# Patient Record
Sex: Female | Born: 1975 | Smoking: Never smoker
Health system: Southern US, Community
[De-identification: ages and names within clinical notes are randomized; demographics above are authoritative.]

## PROBLEM LIST (undated history)

## (undated) DIAGNOSIS — E119 Type 2 diabetes mellitus without complications: Secondary | ICD-10-CM

## (undated) DIAGNOSIS — E669 Obesity, unspecified: Secondary | ICD-10-CM

## (undated) DIAGNOSIS — Z9289 Personal history of other medical treatment: Secondary | ICD-10-CM

## (undated) DIAGNOSIS — E039 Hypothyroidism, unspecified: Secondary | ICD-10-CM

## (undated) HISTORY — DX: Type 2 diabetes mellitus without complications: E11.9

## (undated) HISTORY — DX: Hypothyroidism, unspecified: E03.9

## (undated) HISTORY — PX: NO PAST SURGERIES: SHX2092

## (undated) HISTORY — DX: Personal history of other medical treatment: Z92.89

## (undated) HISTORY — DX: Obesity, unspecified: E66.9

---

## 2004-04-22 ENCOUNTER — Inpatient Hospital Stay: Payer: Self-pay

## 2014-01-04 LAB — HM PAP SMEAR

## 2014-01-11 LAB — HM MAMMOGRAPHY

## 2017-03-24 ENCOUNTER — Ambulatory Visit (INDEPENDENT_AMBULATORY_CARE_PROVIDER_SITE_OTHER): Payer: BLUE CROSS/BLUE SHIELD | Admitting: Maternal Newborn

## 2017-03-24 ENCOUNTER — Encounter: Payer: Self-pay | Admitting: Maternal Newborn

## 2017-03-24 VITALS — BP 110/70 | HR 84 | Ht 61.0 in | Wt 235.0 lb

## 2017-03-24 DIAGNOSIS — N393 Stress incontinence (female) (male): Secondary | ICD-10-CM | POA: Diagnosis not present

## 2017-03-24 DIAGNOSIS — Z1239 Encounter for other screening for malignant neoplasm of breast: Secondary | ICD-10-CM

## 2017-03-24 DIAGNOSIS — Z8742 Personal history of other diseases of the female genital tract: Secondary | ICD-10-CM | POA: Insufficient documentation

## 2017-03-24 DIAGNOSIS — Z87898 Personal history of other specified conditions: Secondary | ICD-10-CM | POA: Diagnosis not present

## 2017-03-24 DIAGNOSIS — Z1231 Encounter for screening mammogram for malignant neoplasm of breast: Secondary | ICD-10-CM

## 2017-03-24 DIAGNOSIS — Z01419 Encounter for gynecological examination (general) (routine) without abnormal findings: Secondary | ICD-10-CM

## 2017-03-24 NOTE — Patient Instructions (Signed)
Kegel Exercises Kegel exercises help strengthen the muscles that support the rectum, vagina, small intestine, bladder, and uterus. Doing Kegel exercises can help:  Improve bladder and bowel control.  Improve sexual response.  Reduce problems and discomfort during pregnancy.  Kegel exercises involve squeezing your pelvic floor muscles, which are the same muscles you squeeze when you try to stop the flow of urine. The exercises can be done while sitting, standing, or lying down, but it is best to vary your position. Phase 1 exercises 1. Squeeze your pelvic floor muscles tight. You should feel a tight lift in your rectal area. If you are a female, you should also feel a tightness in your vaginal area. Keep your stomach, buttocks, and legs relaxed. 2. Hold the muscles tight for up to 10 seconds. 3. Relax your muscles. Repeat this exercise 50 times a day or as many times as told by your health care provider. Continue to do this exercise for at least 4-6 weeks or for as long as told by your health care provider. This information is not intended to replace advice given to you by your health care provider. Make sure you discuss any questions you have with your health care provider. Document Released: 03/08/2012 Document Revised: 11/15/2015 Document Reviewed: 02/09/2015 Elsevier Interactive Patient Education  2018 [employer] Ejercicios de Engineer, siteKegel (Kegel Exercises) Los ejercicios de Kegel ayudan a fortalecer los msculos que sostienen el recto, la vagina, el intestino delgado, la vejiga y [place] tero. Los ejercicios de Kegel pueden ayudar a lo siguiente:  Mejorar el control de la vejiga y de los intestinos.  Mejorar la respuesta sexual.  Reducir los problemas o las molestias durante el New Siteembarazo. Los ejercicios de Kegel implican apretar los msculos del suelo plvico, que son los mismos msculos que comprime cuando trata de TEFL teacherdetener el flujo de la [place] Pro[place] Groundorina. Los ejercicios se pueden Ecologistrealizar mientras est  sentado, parado o [place]ostado, pero lo mejor es variar la posicin. EJERCICIOS DE KEGEL 1. Apriete bien los msculos del suelo plvico. Debera sentir una elevacin firme en el rea del recto. Si es [place], tambin debera sentir una compresin en el rea de la vagina. Mantenga el [place], las nalgas y las piernas [address]relajadas. 2. Mantenga los msculos apretados durante 10segundos como mximo. 3. Relaje los msculos. Repita este ejercicio 50veces al da o como se lo haya indicado el mdico. Contine haciendo este ejercicio durante al menos 4 a 6semanas y Therapist, sportsdurante el tiempo que le haya indicado el mdico. Esta informacin no tiene Theme park managercomo fin reemplazar el consejo del mdico. Asegrese de hacerle al mdico cualquier pregunta que tenga. Document Released: 03/08/2012 Document Revised: 02/09/2015 Document Reviewed: 02/09/2015 Elsevier Interactive Patient Education  [employer]2018 Elsevier Inc.

## 2017-03-24 NOTE — Progress Notes (Signed)
Gynecology Annual Exam  PCP: Patient, No Pcp Per  Chief Complaint:  Chief Complaint  Patient presents with  . Gynecologic Exam    abnl pap at PCP.    History of Present Illness: Patient is a 41 y.o. G3P3003 presents for annual exam. The patient complains of leaking urine with coughing/sneezing and abnormal Pap today.   LMP: No LMP recorded (lmp unknown). Menses absent with Nexplanon.  The patient is not currently sexually active. She currently uses Nexplanon for contraception. She denies dyspareunia.  The patient does not perform self breast exams.  There is no notable family history of breast or ovarian cancer in her family.  The patient wears seatbelts: yes.   The patient has regular exercise: no.    The patient denies current symptoms of depression.    Review of Systems  Constitutional: Negative.   HENT: Negative.   Eyes: Negative.   Respiratory: Negative for cough, shortness of breath and wheezing.   Cardiovascular: Negative for chest pain and palpitations.  Gastrointestinal: Negative for abdominal pain, constipation, diarrhea and nausea.  Genitourinary: Positive for urgency.       Some urine leakage with coughing/sneezing  Musculoskeletal: Negative.   Skin: Negative.   Neurological: Negative.   Endo/Heme/Allergies: Negative.   Psychiatric/Behavioral: Negative.   All other systems reviewed and are negative.   Past Medical History:  Past Medical History:  Diagnosis Date  . History of Papanicolaou smear of cervix 01/22/2011; [id number]   NEG; -/-  . Hypothyroidism   . Obesity     Past Surgical History:  Past Surgical History:  Procedure Laterality Date  . NO PAST SURGERIES      Gynecologic History:  No LMP recorded (lmp unknown). Contraception: Nexplanon Last Pap: on record 01/04/2014  Results were: NIL and HR HPV negative  Patient had an abnormal Pap last year with PCP but does not know exact date or results, was advised to follow-up in one year. Last  mammogram: 01/17/2014  Unable to see results of diagnostic mammogram, patient states it was normal.  Obstetric History: [id number]  Family History:  Family History  Problem Relation Age of Onset  . Hyperlipidemia Mother   . Hypertension Mother   . Diabetes Father        TYPE 2  . Hypertension Father   . Diabetes Brother        TYPE 2  . Diabetes Sister        TYPE 2    Social History:  Social History   Socioeconomic History  . Marital status: Divorced    Spouse name: Not on file  . Number of children: 3  . Years of education: Not on file  . Highest education level: Not on file  Social Needs  . Financial resource strain: Not on file  . Food insecurity - worry: Not on file  . Food insecurity - inability: Not on file  . Transportation needs - medical: Not on file  . Transportation needs - non-medical: Not on file  Occupational History  . Not on file  Tobacco Use  . Smoking status: Never Smoker  . Smokeless tobacco: Never Used  Substance and Sexual Activity  . Alcohol use: No    Frequency: Never  . Drug use: No  . Sexual activity: Not Currently    Birth control/protection: Implant  Other Topics Concern  . Not on file  Social History Narrative  . Not on file    Allergies:  No Known Allergies  Medications: Prior to  Admission medications   Not on File    Physical Exam Vitals: Blood pressure 110/70, pulse 84, height 5\' 1"  (1.549 m), weight 235 lb (106.6 kg).  General: NAD HEENT: normocephalic, anicteric Thyroid: no enlargement, no palpable nodules Pulmonary: No increased work of breathing, CTAB Cardiovascular: RRR, distal pulses 2+ Breast: Breasts symmetrical, no tenderness, no palpable nodules or masses, no skin or nipple retraction present, no nipple discharge.  No axillary or supraclavicular lymphadenopathy. Abdomen: NABS, soft, non-tender, non-distended.  Umbilicus without lesions.  No hepatomegaly, splenomegaly or masses palpable. No evidence of hernia    Genitourinary:  External: Normal external female genitalia.  Normal urethral  meatus, normal Bartholin's and Skene's glands.    Vagina: Normal vaginal mucosa, no evidence of prolapse.    Cervix: Grossly normal in appearance, no bleeding  Uterus: Non-enlarged, mobile, normal contour.  No CMT  Adnexa: ovaries non-enlarged, no adnexal masses  Rectal: deferred  Lymphatic: no evidence of inguinal lymphadenopathy Extremities: no edema, erythema, or tenderness Neurologic: Grossly intact Psychiatric: mood appropriate, affect full  Assessment: 41 y.o. G3P3003 routine annual exam with SUI.  Plan: Problem List Items Addressed This Visit    History of abnormal cervical Pap smear - Primary   Relevant Orders   Pap IG and HPV (high risk) DNA detection   SUI (stress urinary incontinence, female)    Other Visit Diagnoses    Encounter for annual routine gynecological examination       Screening for breast cancer       Relevant Orders   MM DIGITAL SCREENING BILATERAL      1) Mammogram - recommend yearly screening mammogram.  Mammogram was ordered today. [facility]. Left voice message with phone number for patient to schedule mammogram.  2) STI screening was offered and declined.  3) ASCCP guidelines and rationale discussed.  Patient opts for every 3 year screening interval if this screen is normal.  4) Contraception - Nexplanon placed in 2017.  5) Colonoscopy -- Screening recommended starting at age 42 for average risk individuals, age 42 for individuals deemed at increased risk (including African Americans) and recommended to continue until age 42.  For patient age 176-85 individualized approach is recommended.  Gold standard screening is via colonoscopy, Cologuard screening is an acceptable alternative for patient unwilling or unable to undergo colonoscopy.  "Colorectal cancer screening for average?risk adults: 2018 guideline update from the American  Cancer Society"CA: A Cancer Journal for Clinicians: Sep 01, 2016.  6) Routine healthcare maintenance including cholesterol, diabetes screening discussed: done with primary provider per patient. Advised increasing exercise and performing breast self-exams.  7) SUI - Advised Kegel exercises and provided instructions and handout in [place] and [place]. Patient to return to care if symptoms do not improve after a few months.  Return in about 1 year (around 03/24/2018) for Annual exam.   Marcelyn BruinsJacelyn Schmid, CNM 03/24/2017  3:39 PM

## 2017-03-26 LAB — PAP IG AND HPV HIGH-RISK
HPV, HIGH-RISK: NEGATIVE
PAP SMEAR COMMENT: 0

## 2017-04-01 ENCOUNTER — Encounter: Payer: Self-pay | Admitting: Maternal Newborn

## 2017-05-04 ENCOUNTER — Telehealth: Payer: Self-pay

## 2017-05-04 NOTE — Telephone Encounter (Signed)
Pt received a call about her results. She is requesting a return call. Cb#[phone]

## 2017-05-10 ENCOUNTER — Other Ambulatory Visit: Payer: Self-pay | Admitting: *Deleted

## 2017-05-10 ENCOUNTER — Inpatient Hospital Stay
Admission: RE | Admit: 2017-05-10 | Discharge: 2017-05-10 | Disposition: A | Discharge status: Home / Self Care | Payer: Self-pay | Source: Ambulatory Visit | Attending: *Deleted | Admitting: *Deleted

## 2017-05-10 DIAGNOSIS — Z9289 Personal history of other medical treatment: Secondary | ICD-10-CM

## 2017-05-12 ENCOUNTER — Telehealth: Payer: Self-pay

## 2017-05-12 ENCOUNTER — Other Ambulatory Visit: Payer: Self-pay | Admitting: Maternal Newborn

## 2017-05-12 DIAGNOSIS — Z1231 Encounter for screening mammogram for malignant neoplasm of breast: Secondary | ICD-10-CM

## 2017-05-12 NOTE — Telephone Encounter (Signed)
Pt called to make her mammogram appointment. They told her she needed a referral. She last saw Bridgette HabermannSchmid.

## 2017-05-16 ENCOUNTER — Other Ambulatory Visit: Payer: Self-pay | Admitting: Maternal Newborn

## 2017-05-16 DIAGNOSIS — R921 Mammographic calcification found on diagnostic imaging of breast: Secondary | ICD-10-CM

## 2017-05-16 NOTE — Telephone Encounter (Signed)
Pt calling checking on this referral.

## 2017-05-16 NOTE — Progress Notes (Signed)
[facility], patient needs ultrasound and diagnostic mammogram to follow up on prior imaging with right breast calcifications present. Cancelled screening mammogram and ordered required imaging.  Marcelyn BruinsJacelyn Schmid, CNM 05/16/2017  4:03 PM

## 2017-05-27 ENCOUNTER — Ambulatory Visit
Admission: RE | Admit: 2017-05-27 | Discharge: 2017-05-27 | Disposition: A | Discharge status: Home / Self Care | Payer: BLUE CROSS/BLUE SHIELD | Source: Ambulatory Visit | Attending: Maternal Newborn | Admitting: Maternal Newborn

## 2017-05-27 ENCOUNTER — Encounter: Payer: Self-pay | Admitting: Radiology

## 2017-05-27 DIAGNOSIS — R921 Mammographic calcification found on diagnostic imaging of breast: Secondary | ICD-10-CM

## 2018-03-10 ENCOUNTER — Other Ambulatory Visit: Payer: Self-pay | Admitting: Family Medicine

## 2018-03-14 ENCOUNTER — Other Ambulatory Visit: Payer: Self-pay | Admitting: Family Medicine

## 2018-03-17 ENCOUNTER — Other Ambulatory Visit: Payer: Self-pay | Admitting: Family Medicine

## 2018-03-21 ENCOUNTER — Other Ambulatory Visit: Payer: Self-pay | Admitting: Family Medicine

## 2018-03-21 DIAGNOSIS — N644 Mastodynia: Secondary | ICD-10-CM

## 2018-03-30 ENCOUNTER — Ambulatory Visit
Admission: RE | Admit: 2018-03-30 | Discharge: 2018-03-30 | Disposition: A | Discharge status: Home / Self Care | Payer: BLUE CROSS/BLUE SHIELD | Source: Ambulatory Visit | Attending: Family Medicine | Admitting: Family Medicine

## 2018-03-30 DIAGNOSIS — N644 Mastodynia: Secondary | ICD-10-CM | POA: Diagnosis not present

## 2019-09-06 ENCOUNTER — Other Ambulatory Visit: Payer: Self-pay | Admitting: Family Medicine

## 2019-09-06 DIAGNOSIS — Z1231 Encounter for screening mammogram for malignant neoplasm of breast: Secondary | ICD-10-CM

## 2020-03-24 ENCOUNTER — Ambulatory Visit (INDEPENDENT_AMBULATORY_CARE_PROVIDER_SITE_OTHER): Payer: Managed Care, Other (non HMO) | Admitting: Obstetrics and Gynecology

## 2020-03-24 ENCOUNTER — Other Ambulatory Visit: Payer: Self-pay

## 2020-03-24 ENCOUNTER — Encounter: Payer: Self-pay | Admitting: Obstetrics and Gynecology

## 2020-03-24 ENCOUNTER — Other Ambulatory Visit (HOSPITAL_COMMUNITY)
Admission: RE | Admit: 2020-03-24 | Discharge: 2020-03-24 | Disposition: A | Discharge status: Home / Self Care | Payer: Managed Care, Other (non HMO) | Source: Ambulatory Visit | Attending: Obstetrics and Gynecology | Admitting: Obstetrics and Gynecology

## 2020-03-24 VITALS — BP 110/70 | Ht 61.0 in | Wt 228.0 lb

## 2020-03-24 DIAGNOSIS — Z124 Encounter for screening for malignant neoplasm of cervix: Secondary | ICD-10-CM | POA: Insufficient documentation

## 2020-03-24 DIAGNOSIS — Z01419 Encounter for gynecological examination (general) (routine) without abnormal findings: Secondary | ICD-10-CM

## 2020-03-24 DIAGNOSIS — Z7185 Encounter for immunization safety counseling: Secondary | ICD-10-CM

## 2020-03-24 DIAGNOSIS — Z1231 Encounter for screening mammogram for malignant neoplasm of breast: Secondary | ICD-10-CM

## 2020-03-24 DIAGNOSIS — Z0000 Encounter for general adult medical examination without abnormal findings: Secondary | ICD-10-CM | POA: Diagnosis not present

## 2020-03-24 DIAGNOSIS — Z1239 Encounter for other screening for malignant neoplasm of breast: Secondary | ICD-10-CM

## 2020-03-24 NOTE — Progress Notes (Signed)
Gynecology Annual Exam  PCP: Guy Begin, MD  Chief Complaint:  Chief Complaint  Patient presents with  . Gynecologic Exam    History of Present Illness: Patient is a 44 y.o. [id number] presents for annual exam. The patient has no complaints today.   LMP: Patient's last menstrual period was 03/13/2020. Average Interval: regular, 28-30 days Duration of flow: 5-6 days Heavy Menses: no Clots: no Intermenstrual Bleeding: no Postcoital Bleeding: not applicable Dysmenorrhea: no   The patient is not sexually active. She currently uses none for contraception. She denies dyspareunia.  The patient does not perform self breast exams.  There is no notable family history of breast or ovarian cancer in her family.  The patient wears seatbelts: yes.   The patient has regular exercise: yes - walking.    Patient reports currently working with a nutritionist for weight loss - hoping to undergo "weight loss" surgery  The patient denies current symptoms of depression.    Review of Systems: Review of Systems  All other systems reviewed and are negative.   Past Medical History:  Patient Active Problem List   Diagnosis Date Noted  . History of abnormal cervical Pap smear 03/24/2017  . SUI (stress urinary incontinence, female) 03/24/2017    Past Surgical History:  Past Surgical History:  Procedure Laterality Date  . NO PAST SURGERIES      Gynecologic History:  Patient's last menstrual period was 03/13/2020. Contraception: none Last Pap: Results were:  no abnormalities  Last mammogram: 03/2018 Results were: BI-RAD I  Obstetric History: [id number]  Family History:  Family History  Problem Relation Age of Onset  . Hyperlipidemia Mother   . Hypertension Mother   . Diabetes Father        TYPE 2  . Hypertension Father   . Diabetes Brother        TYPE 2  . Diabetes Sister        TYPE 2  . Breast cancer Neg Hx     Social History:  Social History   Socioeconomic History  .  Marital status: Divorced    Spouse name: Not on file  . Number of children: 3  . Years of education: Not on file  . Highest education level: Not on file  Occupational History  . Not on file  Tobacco Use  . Smoking status: Never Smoker  . Smokeless tobacco: Never Used  Vaping Use  . Vaping Use: Never used  Substance and Sexual Activity  . Alcohol use: No  . Drug use: No  . Sexual activity: Not Currently    Birth control/protection: None  Other Topics Concern  . Not on file  Social History Narrative  . Not on file   Social Determinants of Health   Financial Resource Strain: Not on file  Food Insecurity: Not on file  Transportation Needs: Not on file  Physical Activity: Not on file  Stress: Not on file  Social Connections: Not on file  Intimate Partner Violence: Not on file    Allergies:  No Known Allergies  Medications: Prior to Admission medications   Medication Sig Start Date End Date Taking? Authorizing Provider  ergocalciferol (VITAMIN D2) 1.25 MG (50000 UT) capsule Take by mouth.   Yes [provider]  levothyroxine (SYNTHROID, LEVOTHROID) 50 MCG tablet Take 50 mcg by mouth daily before breakfast.   Yes [provider]  metFORMIN (GLUMETZA) 1000 MG (MOD) 24 hr tablet Take by mouth.   Yes [provider]  Multiple  Vitamin (MULTI-VITAMIN) tablet Take 1 tablet by mouth daily.   Yes [provider]  rosuvastatin (CRESTOR) 10 MG tablet Take by mouth.   Yes [provider]  etonogestrel (NEXPLANON) 68 MG IMPL implant 1 each by Subdermal route once. Patient not taking: Reported on 03/24/2020 02/23/16   [provider]    Physical Exam Vitals: Blood pressure 110/70, height 5\' 1"  (1.549 m), weight 228 lb (103.4 kg), last menstrual period 03/13/2020.  General: NAD HEENT: normocephalic, anicteric Thyroid: no enlargement, no palpable nodules Pulmonary: No increased work of breathing, CTAB Cardiovascular: RRR, distal  pulses 2+ Breast: Breast symmetrical, no tenderness, no palpable nodules or masses, no skin or nipple retraction present, no nipple discharge.  No axillary or supraclavicular lymphadenopathy. Abdomen: NABS, soft, non-tender, non-distended.  Umbilicus without lesions.  No hepatomegaly, splenomegaly or masses palpable. No evidence of hernia  Genitourinary:  External: Normal external female genitalia.  Normal urethral meatus, normal Bartholin's and Skene's glands.    Vagina: Normal vaginal mucosa, no evidence of prolapse.    Cervix: Grossly normal in appearance, no bleeding  Uterus: Non-enlarged, mobile, normal contour.  No CMT  Adnexa: ovaries non-enlarged, no adnexal masses  Rectal: deferred  Lymphatic: no evidence of inguinal lymphadenopathy Extremities: no edema, erythema, or tenderness Neurologic: Grossly intact Psychiatric: mood appropriate, affect full  Female chaperone present for pelvic and breast  portions of the physical exam    Assessment: 44 y.o. G3P3003 routine annual exam  Plan: Problem List Items Addressed This Visit   None   Visit Diagnoses    Screening for cervical cancer    -  Primary   Relevant Orders   Cytology - PAP   Immunization counseling       Encounter for gynecological examination without abnormal finding       Routine health maintenance       Screening mammogram for breast cancer       Relevant Orders   MM DIGITAL SCREENING BILATERAL   Screening breast examination          1) Mammogram - recommend yearly screening mammogram.  Mammogram Was ordered today   2) STI screening  wasoffered and declined  3) ASCCP guidelines and rational discussed.  Patient opts for every 3 years screening interval  4) Contraception - the patient is currently using  none.  She declines contraception and reports being sexually inactive.  5) Colonoscopy -- Screening recommended starting at age 45 for average risk individuals, age 36 for individuals deemed at increased  risk (including African Americans) and recommended to continue until age 21.  For patient age 107-85 individualized approach is recommended.  Gold standard screening is via colonoscopy, Cologuard screening is an acceptable alternative for patient unwilling or unable to undergo colonoscopy.  "Colorectal cancer screening for average?risk adults: 2018 guideline update from the American Cancer Society"CA: A Cancer Journal for Clinicians: Sep 01, 2016  - Discussed recommendation for colonoscopy - patient declines referral today and will discuss further with PCP  6) Routine healthcare maintenance including cholesterol, diabetes screening discussed managed by PCP  7) RTC in 1 year for annual exam or as needed  September 03, 2016, CNM, MSN [facility], [facility] 03/24/2020, 3:52 PM

## 2020-03-26 LAB — CYTOLOGY - PAP
Comment: NEGATIVE
Diagnosis: NEGATIVE
High risk HPV: NEGATIVE

## 2020-08-22 IMAGING — MG DIGITAL DIAGNOSTIC UNILATERAL LEFT MAMMOGRAM WITH TOMO AND CAD
6 of 10 series · 6 of 30 positions shown · non-contrast
Comparison: Previous exam(s).

ACR Breast Density Category a: The breast tissue is almost entirely
fatty.

CLINICAL DATA: 42-year-old female with focal pain in the LOWER LEFT
breast for 4 weeks.

EXAM:
DIGITAL DIAGNOSTIC LEFT MAMMOGRAM WITH CAD AND TOMO
ULTRASOUND LEFT BREAST

[L CC synth-2D (1 of 2)]
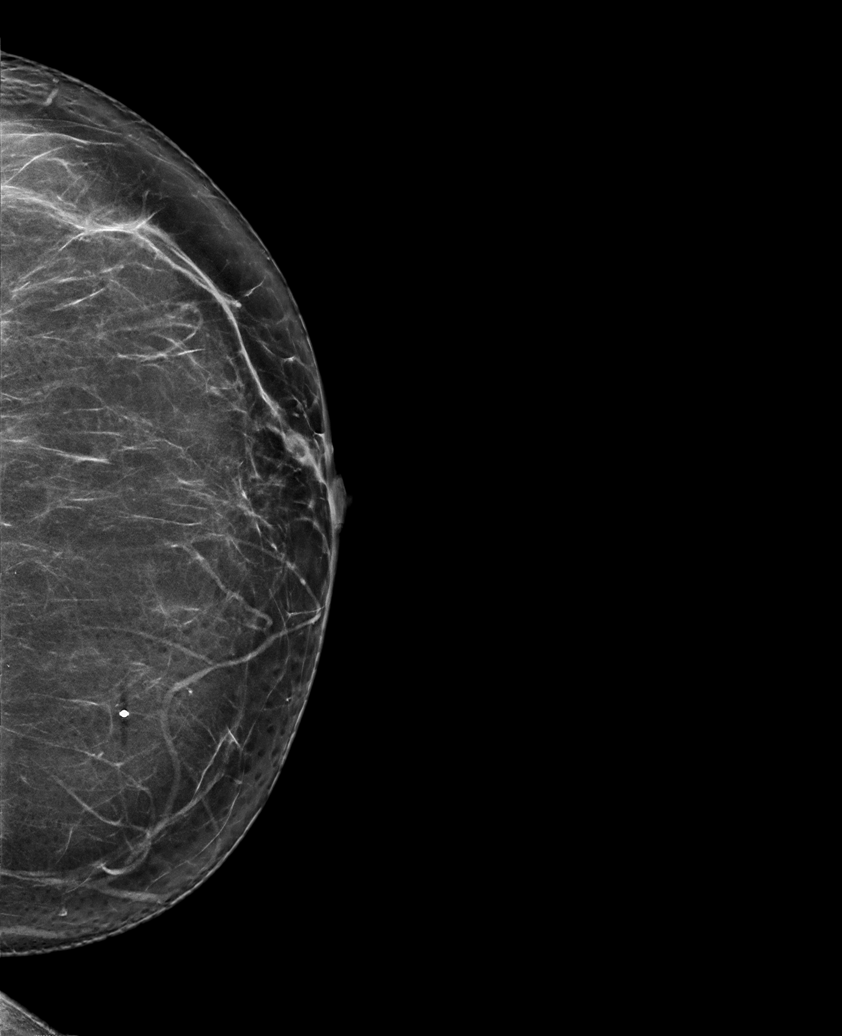

[L CC synth-2D (2 of 2)]
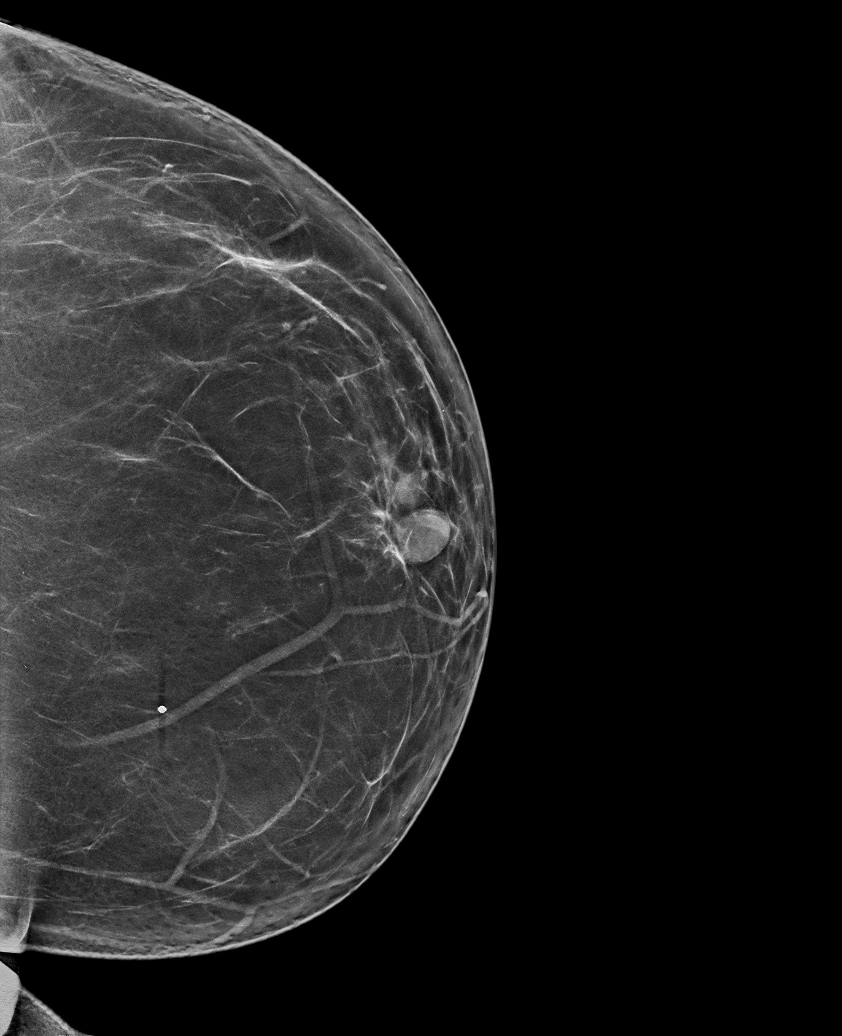

[L TAN synth-2D]
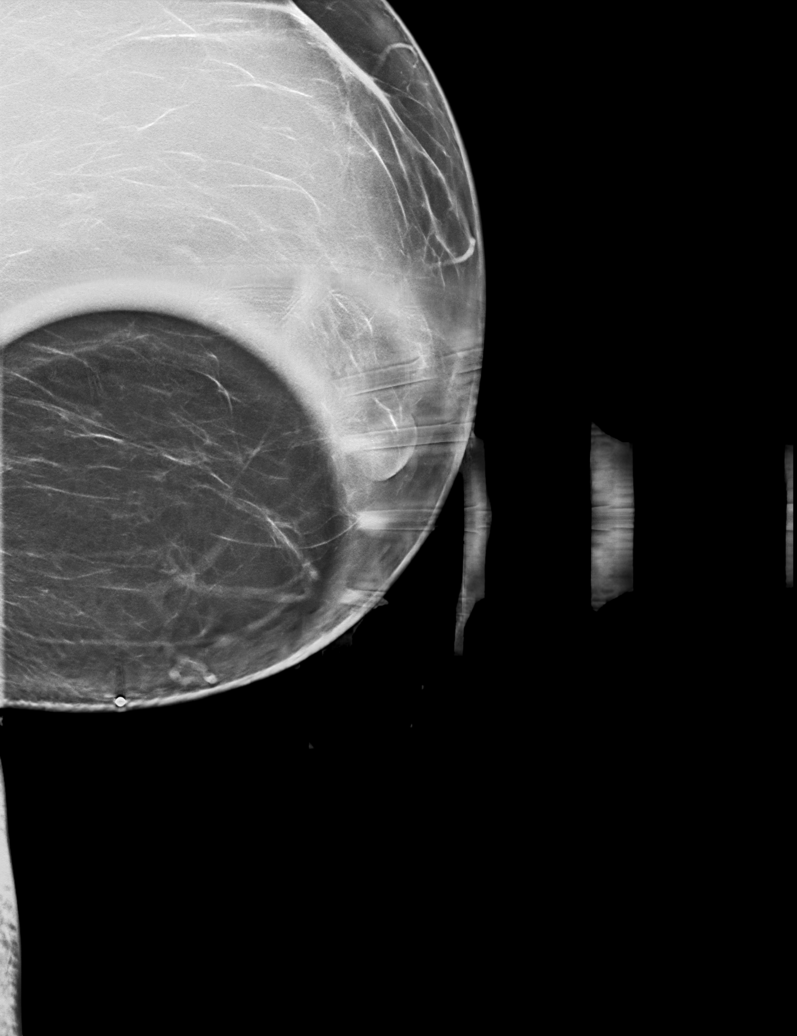

[L MLO synth-2D (1 of 2)]
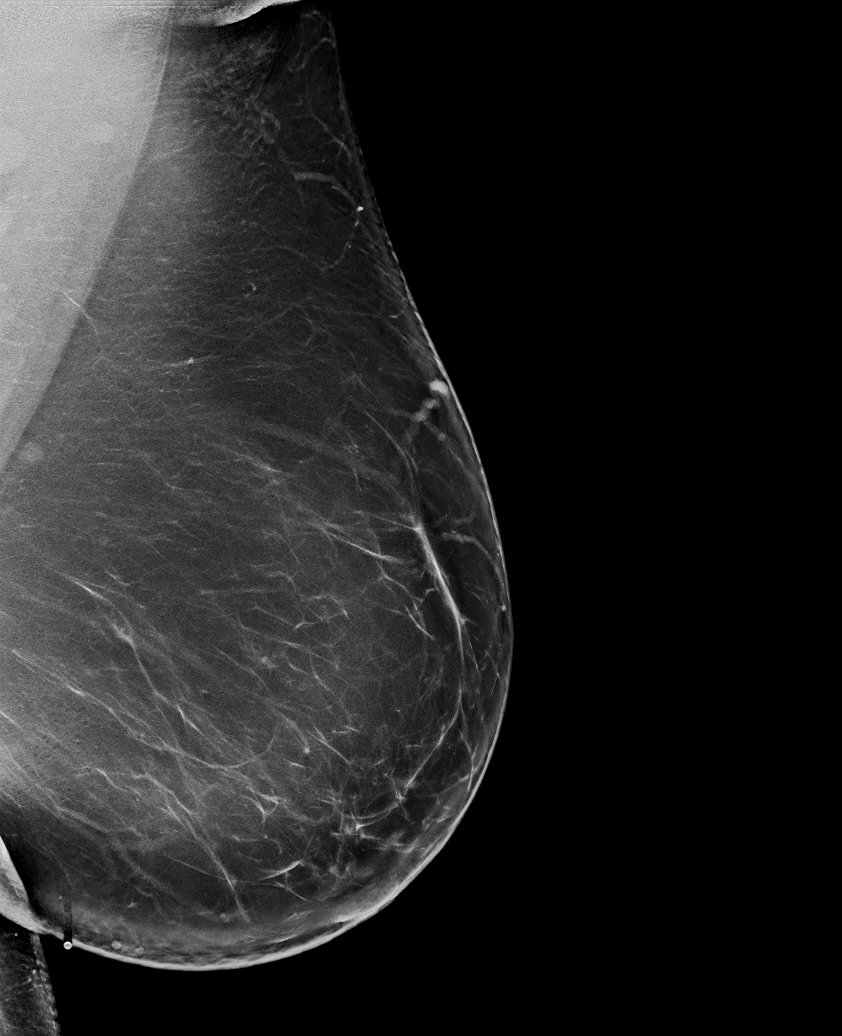

[L MLO synth-2D (2 of 2)]
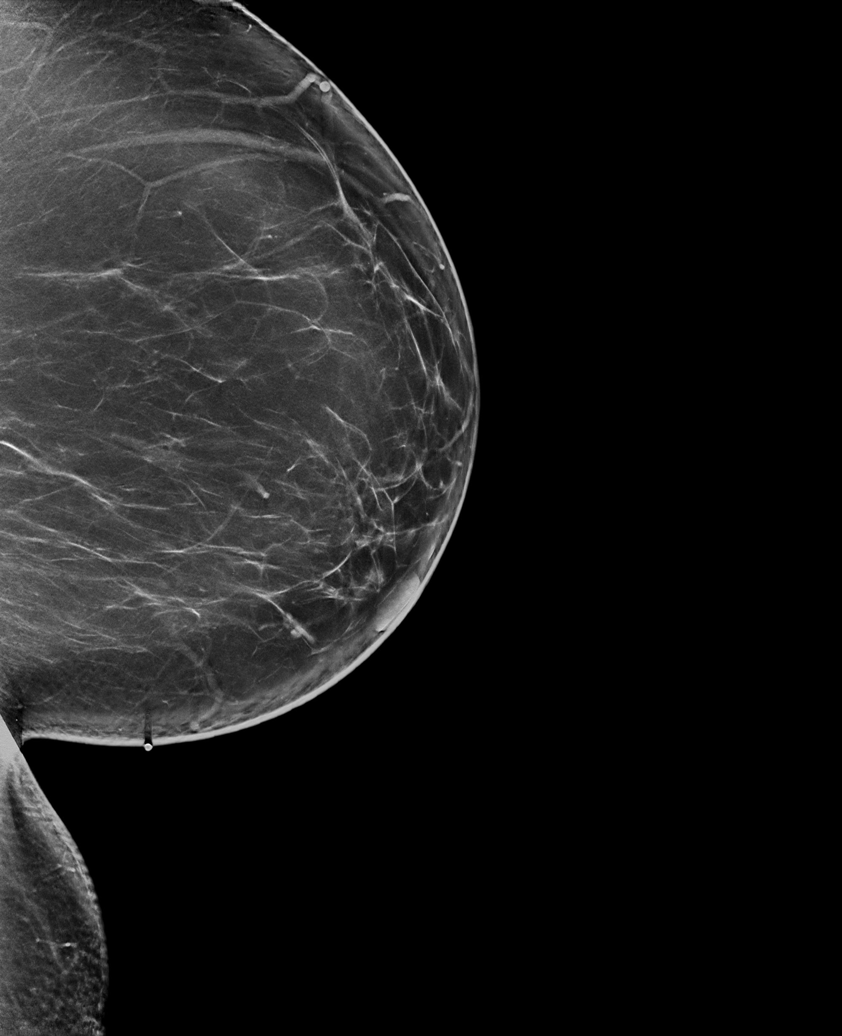

[L CC tomo · tomo slice 41/82.0]
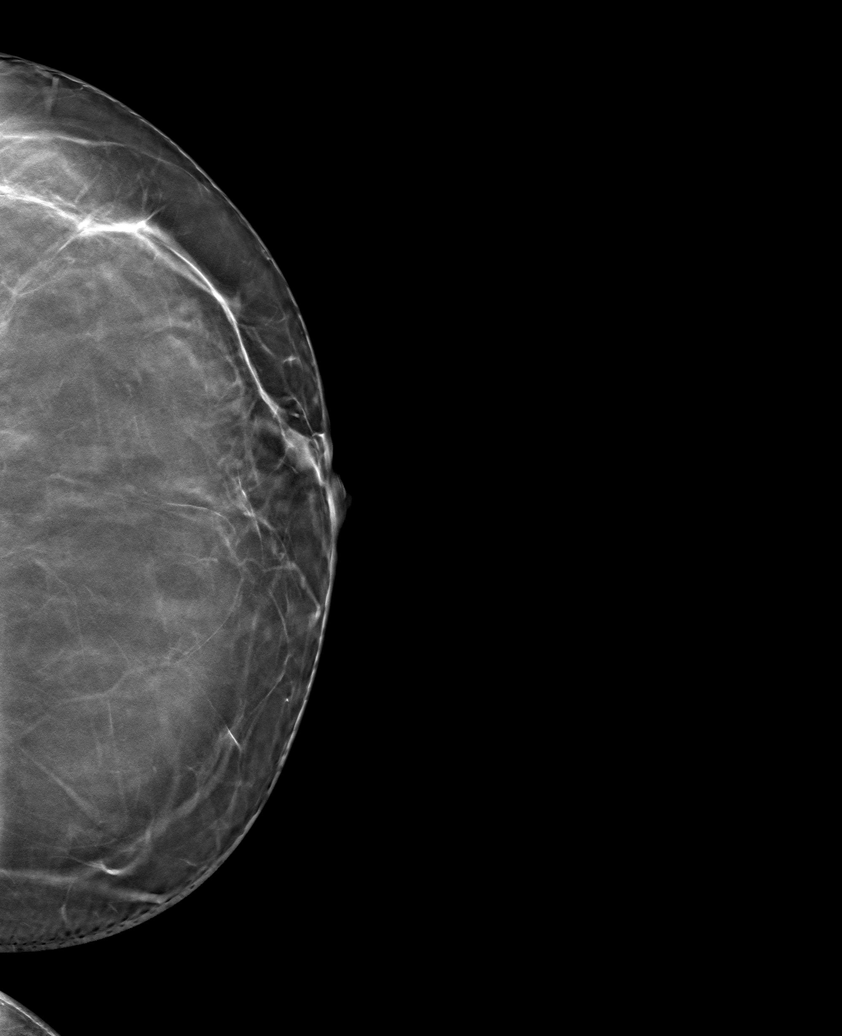

[6 of 30 positions shown; findings below may reference images not displayed]

FINDINGS: 2D/3D full field and spot compression views of the LEFT breast
demonstrate no suspicious mass, distortion or worrisome
calcifications.

Mammographic images were processed with CAD.

Targeted ultrasound is performed, showing no solid or cystic mass,
distortion or abnormal shadowing within the LOWER LEFT breast, in
the area of patient's pain.
IMPRESSION: No mammographic or sonographic abnormality within the LOWER LEFT
breast, in the area of patient's pain.

RECOMMENDATION:
Bilateral screening mammogram in 2 months to resume annual mammogram
schedule.

I have discussed the findings, causes of breast pain and
recommendations with the patient. Results were also provided in
writing at the conclusion of the visit. If applicable, a reminder
letter will be sent to the patient regarding the next appointment.

BI-RADS CATEGORY  1: Negative.

## 2022-11-19 ENCOUNTER — Other Ambulatory Visit: Payer: Self-pay | Admitting: Family Medicine

## 2022-11-19 DIAGNOSIS — Z1231 Encounter for screening mammogram for malignant neoplasm of breast: Secondary | ICD-10-CM

## 2022-12-10 ENCOUNTER — Ambulatory Visit
Admission: RE | Admit: 2022-12-10 | Discharge: 2022-12-10 | Disposition: A | Discharge status: Home / Self Care | Payer: Managed Care, Other (non HMO) | Source: Ambulatory Visit | Attending: Family Medicine | Admitting: Family Medicine

## 2022-12-10 DIAGNOSIS — Z1231 Encounter for screening mammogram for malignant neoplasm of breast: Secondary | ICD-10-CM | POA: Diagnosis present

## 2022-12-13 ENCOUNTER — Inpatient Hospital Stay
Admission: RE | Admit: 2022-12-13 | Discharge: 2022-12-13 | Disposition: A | Discharge status: Home / Self Care | Payer: Self-pay | Source: Ambulatory Visit | Attending: Family Medicine | Admitting: Family Medicine

## 2022-12-13 ENCOUNTER — Other Ambulatory Visit: Payer: Self-pay | Admitting: Family Medicine

## 2022-12-13 DIAGNOSIS — Z1231 Encounter for screening mammogram for malignant neoplasm of breast: Secondary | ICD-10-CM

## 2024-01-31 ENCOUNTER — Other Ambulatory Visit: Payer: Self-pay | Admitting: Family Medicine

## 2024-01-31 DIAGNOSIS — Z1231 Encounter for screening mammogram for malignant neoplasm of breast: Secondary | ICD-10-CM

## 2024-03-09 ENCOUNTER — Ambulatory Visit
Admission: RE | Admit: 2024-03-09 | Discharge: 2024-03-09 | Disposition: A | Source: Ambulatory Visit | Attending: Family Medicine | Admitting: Family Medicine

## 2024-03-09 DIAGNOSIS — Z1231 Encounter for screening mammogram for malignant neoplasm of breast: Secondary | ICD-10-CM
